# Patient Record
Sex: Male | Born: 1985 | Race: Black or African American | Hispanic: No | Marital: Single | Smoking: Never smoker
Health system: Southern US, Community
[De-identification: ages and names within clinical notes are randomized; demographics above are authoritative.]

## PROBLEM LIST (undated history)

## (undated) HISTORY — PX: KNEE SURGERY: SHX244

---
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
## 2006-01-03 ENCOUNTER — Emergency Department (HOSPITAL_COMMUNITY): Admission: EM | Admit: 2006-01-03 | Discharge: 2006-01-03 | Payer: Self-pay | Admitting: Family Medicine

## 2006-07-08 ENCOUNTER — Emergency Department (HOSPITAL_COMMUNITY): Admission: EM | Admit: 2006-07-08 | Discharge: 2006-07-08 | Payer: Self-pay | Admitting: Family Medicine

## 2007-01-09 ENCOUNTER — Emergency Department (HOSPITAL_COMMUNITY): Admission: EM | Admit: 2007-01-09 | Discharge: 2007-01-09 | Payer: Self-pay | Admitting: Family Medicine

## 2007-05-03 ENCOUNTER — Emergency Department (HOSPITAL_COMMUNITY): Admission: EM | Admit: 2007-05-03 | Discharge: 2007-05-03 | Payer: Self-pay | Admitting: Family Medicine

## 2007-06-28 ENCOUNTER — Emergency Department (HOSPITAL_COMMUNITY): Admission: EM | Admit: 2007-06-28 | Discharge: 2007-06-28 | Payer: Self-pay | Admitting: Neurosurgery

## 2007-07-13 ENCOUNTER — Emergency Department (HOSPITAL_COMMUNITY): Admission: EM | Admit: 2007-07-13 | Discharge: 2007-07-13 | Payer: Self-pay | Admitting: Emergency Medicine

## 2007-08-08 ENCOUNTER — Emergency Department (HOSPITAL_COMMUNITY): Admission: EM | Admit: 2007-08-08 | Discharge: 2007-08-08 | Payer: Self-pay | Admitting: Family Medicine

## 2008-03-19 ENCOUNTER — Emergency Department (HOSPITAL_COMMUNITY): Admission: EM | Admit: 2008-03-19 | Discharge: 2008-03-19 | Payer: Self-pay | Admitting: Emergency Medicine

## 2008-03-27 ENCOUNTER — Emergency Department (HOSPITAL_COMMUNITY): Admission: EM | Admit: 2008-03-27 | Discharge: 2008-03-27 | Payer: Self-pay | Admitting: Emergency Medicine

## 2008-11-28 ENCOUNTER — Emergency Department (HOSPITAL_COMMUNITY): Admission: EM | Admit: 2008-11-28 | Discharge: 2008-11-28 | Payer: Self-pay | Admitting: Family Medicine

## 2009-05-28 IMAGING — CR DG CHEST 2V
2 series · 2 of 2 positions shown · non-contrast
Comparison: 12/19/2005.  07/01/2002.

CLINICAL DATA: Cough.  History of bronchitis.

CHEST - 2 VIEW

[w chest pa *]
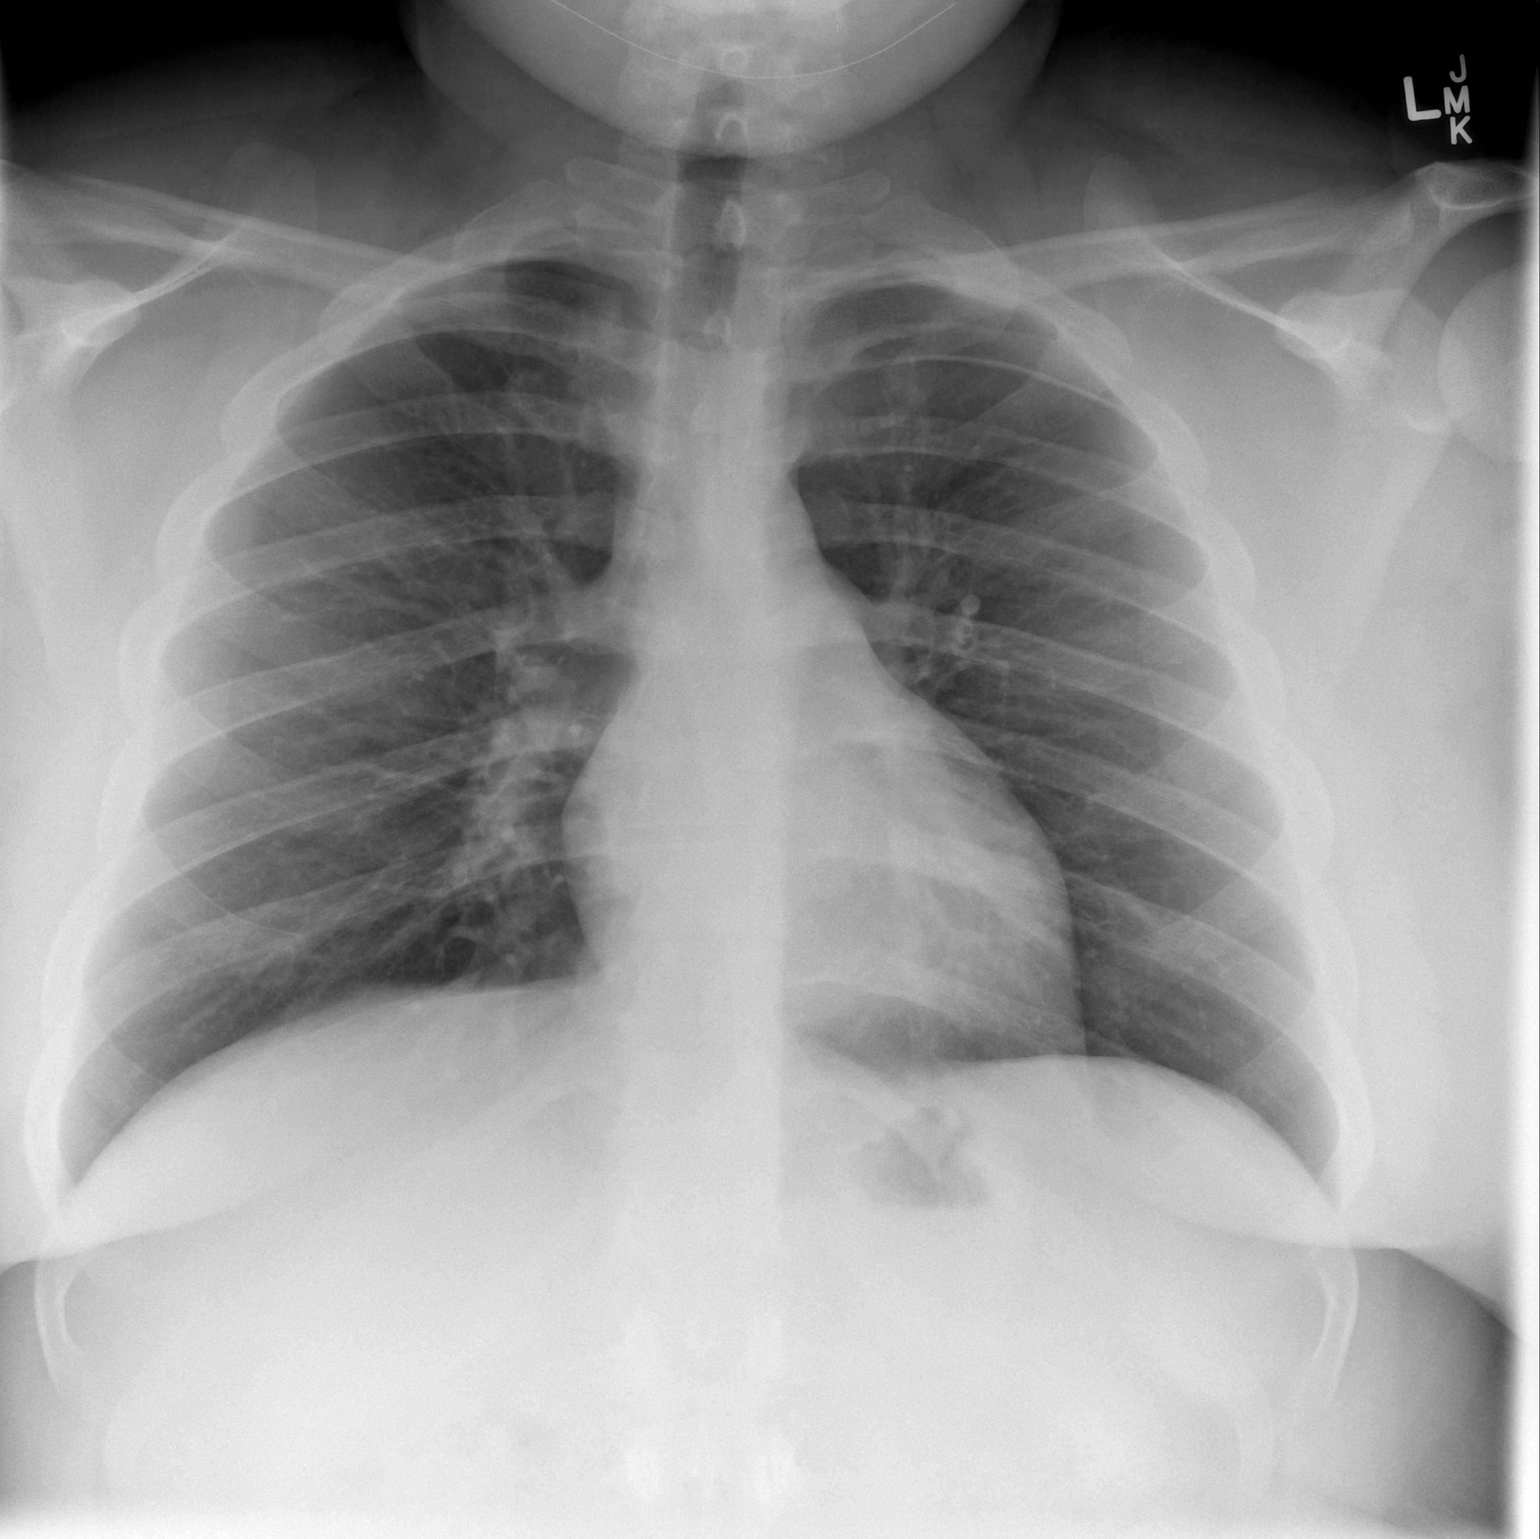

[w chest lat *]
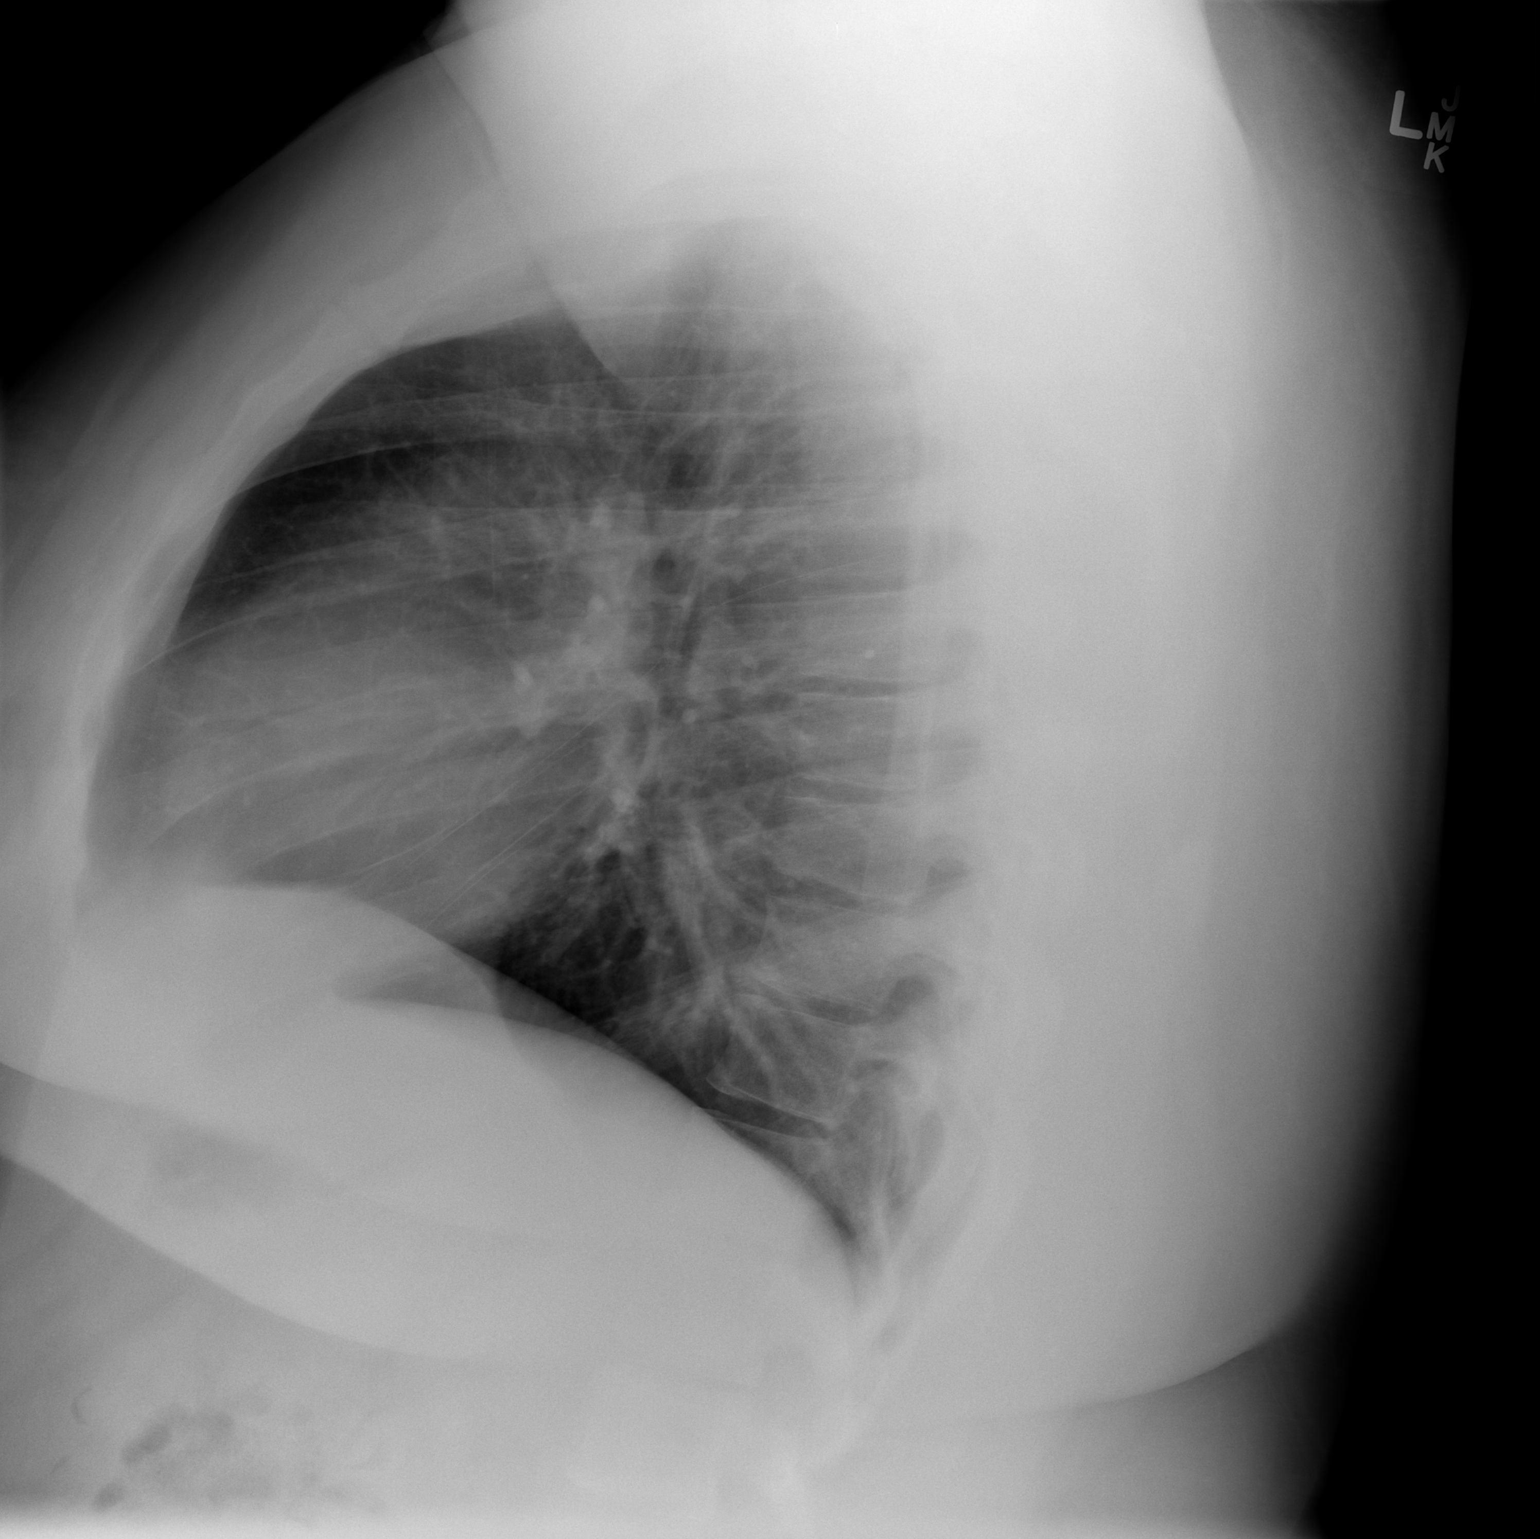

[2 of 2 positions shown; findings below may reference images not displayed]

FINDINGS: Mediastinal and cardiac silhouette unchanged and within
normal limits.  No infiltrate, congestive heart failure or
pneumothorax.
IMPRESSION: No infiltrate or congestive heart failure.

## 2010-06-12 ENCOUNTER — Emergency Department (HOSPITAL_COMMUNITY)
Admission: EM | Admit: 2010-06-12 | Discharge: 2010-06-12 | Payer: Self-pay | Source: Home / Self Care | Admitting: Family Medicine

## 2010-10-19 ENCOUNTER — Inpatient Hospital Stay (INDEPENDENT_AMBULATORY_CARE_PROVIDER_SITE_OTHER)
Admission: RE | Admit: 2010-10-19 | Discharge: 2010-10-19 | Disposition: A | Discharge status: Home / Self Care | Payer: Self-pay | Source: Ambulatory Visit | Attending: Family Medicine | Admitting: Family Medicine

## 2010-10-19 DIAGNOSIS — Z760 Encounter for issue of repeat prescription: Secondary | ICD-10-CM

## 2010-10-19 DIAGNOSIS — M25529 Pain in unspecified elbow: Secondary | ICD-10-CM

## 2010-10-30 NOTE — Op Note (Signed)
NAMEKALID, GHAN                         ACCOUNT NO.:  [id number]   MEDICAL RECORD NO.:  [id number]                   PATIENT TYPE:  AMB   LOCATION:  DSC                                  FACILITY:  [facility]   PHYSICIAN:  Rodney A. Chaney Malling, M.D.           DATE OF BIRTH:  28-May-1986   DATE OF PROCEDURE:  DATE OF DISCHARGE:                                 OPERATIVE REPORT   PREOPERATIVE DIAGNOSIS:  Ruptured acromioclavicular ligament, right knee.   POSTOPERATIVE DIAGNOSIS:  Ruptured acromioclavicular ligament, right knee.   OPERATION:  Reconstruction ACL, right knee, using patellar-tendon-patellar  bone, tibial tubercle bone with interference fixed screws.   SURGEON:  Dr. Chaney Malling.   ASSISTANT:  Dr. Alisa Graff.   ANESTHESIA:  General.   DESCRIPTION OF PROCEDURE:  The patient was placed on the operating room  table, in the supine position with a pneumatic tourniquet about the right  upper thigh.  The right leg was placed in the leg holder.  The entire right  lower extremity was prepped with Duraprep and draped out in the usual  manner.  An effusion cannula was placed in the superomedial pouch and used  the standard setting.  Very careful examination of the knee was undertaken.  The patellofemoral joint appeared normal with normal articular cartilage.  Both medial and lateral compartments were visualized.  I checked the  cartilage over both femoral condyles and both tibial plateaus and the entire  circumference of both medial and lateral compartments was normal.  The only  pathology was seen in the intercondylar notch where there was complete  disruption of the ACL from its femoral attachment.  This was displaced into  the lateral compartment.   With the arthroscope, the stump of the ACL was very aggressively debrided.  Once this was completed, the leg was wrapped out with Esmarch and returned  to elevator.  Incision made starting in the mid portion of the patella and  carried  down to the tibial tubercle.  The skin edges were retracted.  Bleeders were coagulated.  Using small scissors, the paratenon was opened  and the margins of the patellar tendon were carefully identified.  Using a  double bladed single handled knife, 10 mm in width, two parallel lines were  started in the middle of the patella and carried down to the tibial  tubercle.  Using a power saw, trapezoidal grafts from the tibial tubercle  and the patella which included patellar tendon graft, were harvested in the  usual manner.  Drill holes were placed to both bony blocks and these were  pared down to 10 mm diameter and placed on a sterile sponge on the back  table.   Attention was turned back to the knee.  Using a burr, a very generous  lateral notch plasty was done.  All debris was removed from the knee.  At  this point, the tibial guide was inserted into the  knee and placed dead  center in a tibial stump up just adjacent to the posterior cruciate  ligament.  Using the drill guide set at the proper length, the guide pin was  passed up into the tibial tubercle and this was felt to be in an anatomic  position.  The 10 mm cannulated drill was passed over the guide pin and  brought up to the tibial tubercle.  All the debris was the removed with the  articular shaver.  With the knee flexed, the femoral guide was passed  through the tibial hole and placed over lateral femoral condyle and held  into position.  A long Beef needle was passed up through the guide and  brought out through the anterior aspect of the thigh. A 10 mm drill was then  passed over the guide pin and a tunnel, 35 mm deep was then made.  The  anterior tip of the shaver was introduced and all debris was removed.  The  arthroscope was passed up into the tunnel and there was god capture at 360  degrees with good bony stump.  The graft was then taken from the back table,  sutures were passed through the eye of the Beef needle and the  sutures  pulled through the knee and the graft up into the knee.  There was excellent  seating into the femoral tunnel.  Fracture was placed on the bony block.  A  flexible guide pin was passed up through the knee with bony block into the  femoral tunnel.  A 7 mm x 30 mm interference screw with a sheath was passed  up and interference screw was driven home.  This gave excellent stability  and fixation proximally in the femur. Traction was then placed on the distal  bony plug, and a guide pin was passed adjacent to the bony plug in the tibia  and a 7 mm x 30 mm interference screw was then inserted here.  There was an  excellent fixation both proximally and distally.  The graft was palpated and  probed, and the graft is extremely snug.  No AP drawer was seen.  Bone was  taken from the tibia and placed in the donor site over the patella. Soft  tissue and paratenon was then closed with 2-0 Vicryl.  Subcutaneous tissue  was closed with 2-0 Vicryl and standard Steri-Strips were used to close the  skin.  This procedure went extremely well.   DRAINS:  None.   COMPLICATIONS:  None.                                               Rodney A. Chaney Malling, M.D.    RAM/MEDQ  D:  07/26/2002  T:  07/26/2002  Job:  [id number]

## 2010-10-30 NOTE — Op Note (Signed)
Derrick Hogan, Derrick Hogan                         ACCOUNT NO.:  [id number]   MEDICAL RECORD NO.:  [id number]                   PATIENT TYPE:  AMB   LOCATION:  DSC                                  FACILITY:  [facility]   PHYSICIAN:  Rodney A. Chaney Malling, M.D.           DATE OF BIRTH:  1986/05/21   DATE OF PROCEDURE:  11/15/2002  DATE OF DISCHARGE:                                 OPERATIVE REPORT   PREOPERATIVE DIAGNOSIS:  Status post reconstruction anterior cruciate  ligament right knee with impingement anterior cruciate ligament right knee.   POSTOPERATIVE DIAGNOSIS:  Status post reconstruction anterior cruciate  ligament right knee with impingement anterior cruciate ligament right knee.   PROCEDURE:  Exploration of right knee and notchplasty right knee.  Local  synovectomy.   SURGEON:  Lenard Galloway. Chaney Malling, M.D.   ANESTHESIA:  General.   FINDINGS:  With the patient on the operative table and under general  anesthesia stability testing was done.  The AP drawer was negative.  Lachman's test was negative.  There was a trace or hint of positive pivot  shift when this maneuver was done, but this was done with the opposite and  normal leg, and they are absolutely symmetrical.   The arthroscope was placed in the knee, and I took the contour over the  patella and femoral notch and both femoral condyles were normal as was  articular cartilage and tibial plateau, and both menisci were normal.  No  other pathology is seen in the femoral notch area.  The ACL was intact.  This was palpated with a probe, and this was still functioning.  Distally on  the medial femoral side of the ACL, there is fraying and tearing of the  synovium.  The underlying fibers of the reconstructed ACL appeared normal  and were palpated again and showed good integrity.  There was a large  deposit of scar tissue anterior to the distal insertion of the reconstructed  ACL.  On full extension of this, it banged into the  anterior notch.  Again,  there is some fraying of the synovium on the medial side implying some  impingement forward and medial notch as the knee was in extension.   DESCRIPTION OF PROCEDURE:  The patient was placed on the operative table in  the supine position.  The pneumatic was placed around the right upper thigh.  The right leg was placed in the leg holder, and the entire right lower  extremity prepped with DuraPrep and draped out in the usual manner.  The  infusion cannula was placed in the superior and medial pouch and the knee  distended with saline.  Anterior medial and anterior left portals were made,  and the arthroscope was introduced.  The findings are as described above.  There was strain of the synovium over the medial side of the ACL just  slightly proximal to its distal insertion.  This was debrided  with the intra-  articular shaver.  A probe was used and the ACL itself was intact.  The AP  drawer was done under direct vision and no translation was seen.  There was  a large scar deposit anterior to the tibial spine, and on full extension  this area banged against the anterior aspect.  This was debrided with a  series of baskets followed the intra-articular distributor.  The frayed  synovium was also debrided.  At this point, it was felt that the notchplasty  should be enlarged.  Through both portals the large bur was inserted.  We  did a notchplasty on the medial side superiorly and laterally.  Great care  was taken to avoid any involvement injury to the reconstructed graft.  Excellent decompression notch was achieved. Once this was completed,  multiple trials were made with the knee in full extension and complete  decompression of the ACL graft was achieved.  There was no rubbing on the  graft in any position.  The chondroplasty shaver was also to remove scar  tissue was bone was burred back.  Again, excellent decompression  was achieved.  Marcaine was then placed, and  large bulky dressing was  applied.  The patient returned to the recovery room in excellent condition.  Technically, this procedure went extremely well.  Drains none.  Complications none.                                               Rodney A. Chaney Malling, M.D.    RAM/MEDQ  D:  11/15/2002  T:  11/15/2002  Job:  [id number]

## 2011-06-28 ENCOUNTER — Emergency Department (INDEPENDENT_AMBULATORY_CARE_PROVIDER_SITE_OTHER)
Admission: EM | Admit: 2011-06-28 | Discharge: 2011-06-28 | Disposition: A | Discharge status: Home / Self Care | Payer: Self-pay | Source: Home / Self Care

## 2011-06-28 ENCOUNTER — Encounter (HOSPITAL_COMMUNITY): Payer: Self-pay

## 2011-06-28 ENCOUNTER — Other Ambulatory Visit: Payer: Self-pay

## 2011-06-28 DIAGNOSIS — R42 Dizziness and giddiness: Secondary | ICD-10-CM

## 2011-06-28 LAB — POCT I-STAT, CHEM 8
Creatinine, Ser: 1.1 mg/dL (ref 0.50–1.35)
Sodium: 141 mEq/L (ref 135–145)
TCO2: 26 mmol/L (ref 0–100)

## 2011-06-28 NOTE — ED Provider Notes (Signed)
History     CSN: [id number]  Arrival date & time 06/28/11  0855   None     Chief Complaint  Patient presents with  . Dizziness    (Consider location/radiation/quality/duration/timing/severity/associated sxs/prior treatment) HPI Comments: Pt states that he has been feeling "woozy" intermittently for the last 3 weeks. He becomes lightheaded with exertion, such as going up the stairs and describes it as "how you feel after you've been drinking alcohol." The lightheadedness will last up to 4 hrs after it begins and sometimes goes away within a few minutes with rest. He also states that eating sometimes brings on the feeling and is concerned about diabetes as it runs in his family. He denies chest pain/pressure, palpitations or dyspnea. No vertigo or HA.    Past Medical History  Diagnosis Date  . Asthma     Past Surgical History  Procedure Date  . Knee surgery     History reviewed. No pertinent family history.  History  Substance Use Topics  . Smoking status: Never Smoker   . Smokeless tobacco: Not on file  . Alcohol Use: Yes     social      Review of Systems  Constitutional: Negative for fever, chills and fatigue.  HENT: Negative for ear pain, sore throat, rhinorrhea, sneezing, postnasal drip and sinus pressure.   Respiratory: Negative for cough, shortness of breath and wheezing.   Cardiovascular: Negative for chest pain and palpitations.  Genitourinary: Negative for dysuria and frequency.  Neurological: Positive for light-headedness.    Allergies  Tuberculin tests  Home Medications   Current Outpatient Rx  Name Route Sig Dispense Refill  . ALBUTEROL IN Inhalation Inhale into the lungs as needed.    . ASPIRIN PO Oral Take by mouth daily.      BP 127/63  Pulse 82  Temp(Src) 98.5 F (36.9 C) (Oral)  Resp 16  SpO2 100%  Physical Exam  Nursing note and vitals reviewed. Constitutional: He appears well-developed and well-nourished. No distress.       Obese    HENT:  Head: Normocephalic and atraumatic.  Right Ear: Tympanic membrane, external ear and ear canal normal.  Left Ear: Tympanic membrane, external ear and ear canal normal.  Nose: Nose normal.  Mouth/Throat: Uvula is midline, oropharynx is clear and moist and mucous membranes are normal. No oropharyngeal exudate, posterior oropharyngeal edema or posterior oropharyngeal erythema.  Eyes: Conjunctivae are normal. Pupils are equal, round, and reactive to light.  Neck: Neck supple.  Cardiovascular: Normal rate, regular rhythm and normal heart sounds.   Pulmonary/Chest: Effort normal and breath sounds normal. No respiratory distress.  Lymphadenopathy:    He has no cervical adenopathy.  Neurological: He is alert.  Skin: Skin is warm and dry.  Psychiatric: He has a normal mood and affect.    ED Course  Procedures (including critical care time)  Labs Reviewed  POCT I-STAT, CHEM 8 - Abnormal; Notable for the following:    Hemoglobin 17.3 (*)    All other components within normal limits  I-STAT, CHEM 8   No results found.   1. Intermittent lightheadedness       MDM  EKG NSR, rate 78. No orthostatic changes. I stat neg .        Melody Comas, [place] 06/28/11 2047

## 2011-06-28 NOTE — ED Notes (Signed)
C/o intermittent dizziness for 3 weeks.  States the dizziness usually occurs with position change or bending over.  Denies recent illness or other sx.

## 2011-06-29 NOTE — ED Provider Notes (Signed)
Medical screening examination/treatment/procedure(s) were performed by non-physician practitioner and as supervising physician I was immediately available for consultation/collaboration.   MORENO-COLL,Sixto Bowdish; MD   Stacye Noori Moreno-Coll, MD 06/29/11 0719 

## 2011-08-16 ENCOUNTER — Encounter: Payer: Self-pay | Admitting: Family

## 2011-08-16 ENCOUNTER — Ambulatory Visit (INDEPENDENT_AMBULATORY_CARE_PROVIDER_SITE_OTHER): Payer: BC Managed Care – PPO | Admitting: Family

## 2011-08-16 DIAGNOSIS — J309 Allergic rhinitis, unspecified: Secondary | ICD-10-CM

## 2011-08-16 DIAGNOSIS — H6980 Other specified disorders of Eustachian tube, unspecified ear: Secondary | ICD-10-CM

## 2011-08-16 DIAGNOSIS — E669 Obesity, unspecified: Secondary | ICD-10-CM

## 2011-08-16 DIAGNOSIS — H6990 Unspecified Eustachian tube disorder, unspecified ear: Secondary | ICD-10-CM

## 2011-08-16 LAB — CBC WITH DIFFERENTIAL/PLATELET
Eosinophils Absolute: 0.3 10*3/uL (ref 0.0–0.7)
Eosinophils Relative: 4.2 % (ref 0.0–5.0)
Lymphs Abs: 2.7 10*3/uL (ref 0.7–4.0)
MCHC: 32.6 g/dL (ref 30.0–36.0)
Monocytes Absolute: 0.7 10*3/uL (ref 0.1–1.0)
Monocytes Relative: 9.4 % (ref 3.0–12.0)
Neutro Abs: 4 10*3/uL (ref 1.4–7.7)
RBC: 5.46 Mil/uL (ref 4.22–5.81)
RDW: 14.4 % (ref 11.5–14.6)

## 2011-08-16 LAB — BASIC METABOLIC PANEL
BUN: 12 mg/dL (ref 6–23)
Calcium: 9.2 mg/dL (ref 8.4–10.5)
Chloride: 107 mEq/L (ref 96–112)
Creatinine, Ser: 1 mg/dL (ref 0.4–1.5)
Potassium: 3.8 mEq/L (ref 3.5–5.1)
Sodium: 139 mEq/L (ref 135–145)

## 2011-08-16 LAB — TSH: TSH: 1.61 u[IU]/mL (ref 0.35–5.50)

## 2011-08-16 MED ORDER — FEXOFENADINE-PSEUDOEPHED ER 180-240 MG PO TB24: 1.0000 | ORAL_TABLET | Freq: Every day | ORAL | Status: AC

## 2011-08-16 NOTE — Progress Notes (Signed)
  Subjective:    Patient ID: Derrick Hogan, male    DOB: 02-26-1986, 26 y.o.   MRN: [id number]  HPI 26 year old [place] American male, nonsmoker the patient to the practice is in with complaints of dizziness that going on for about a month. Patient reports having random feelings of dizziness with sudden onset usually when he is moving around. The dizziness typically last for hours or less and it is associated with right ear popping. He denies any sneezing, coughing, congestion, blurred vision, double vision, chest pain or palpitations. He does have a past medical history of allergic rhinitis. Not currently taking any medication for it.   Review of Systems  Constitutional: Negative.   HENT: Negative.  Negative for congestion, rhinorrhea, sneezing and postnasal drip.        Ears popping  Respiratory: Negative.   Cardiovascular: Negative.   Gastrointestinal: Negative.   Musculoskeletal: Negative.   Skin: Negative.   Neurological: Negative.  Negative for headaches.  Hematological: Negative.   Psychiatric/Behavioral: Negative.    Past Medical History  Diagnosis Date  . Asthma     History   Social History  . Marital Status: Single    Spouse Name: N/A    Number of Children: N/A  . Years of Education: N/A   Occupational History  . Not on file.   Social History Main Topics  . Smoking status: Never Smoker   . Smokeless tobacco: Not on file  . Alcohol Use: Yes     social  . Drug Use: No  . Sexually Active:    Other Topics Concern  . Not on file   Social History Narrative  . No narrative on file    Past Surgical History  Procedure Date  . Knee surgery     No family history on file.  Allergies  Allergen Reactions  . Tuberculin Tests     Current Outpatient Prescriptions on File Prior to Visit  Medication Sig Dispense Refill  . ALBUTEROL IN Inhale into the lungs as needed.      . ASPIRIN PO Take by mouth daily.        BP 124/80  Ht 5\' 11"  (1.803 m)  Wt 360 lb  (163.295 kg)  BMI 50.21 kg/m2chart    Objective:   Physical Exam  Constitutional: He appears well-developed and well-nourished.  HENT:  Right Ear: External ear normal.  Left Ear: External ear normal.  Nose: Nose normal.  Mouth/Throat: Oropharynx is clear and moist.  Neck: Normal range of motion. Neck supple.  Cardiovascular: Normal rate, regular rhythm and normal heart sounds.   Pulmonary/Chest: Effort normal and breath sounds normal.  Musculoskeletal: Normal range of motion.  Neurological: He is alert.  Skin: Skin is warm and dry.  Psychiatric: He has a normal mood and affect.          Assessment & Plan:  Assessment: Eustachian tube dysfunction, vertigo  Plan: Allegra-D 24 hours once daily. Labs and to include CBC, BMP, TSH patient had a right salt encouraged healthy diet and exercise. Patient to call the office if symptoms worsen or persist, recheck as scheduled and when necessary.

## 2011-08-16 NOTE — Patient Instructions (Signed)
Barotitis Media Barotitis media is soreness (inflammation) of the area behind the eardrum (middle ear). This occurs when the auditory tube (Eustachian tube) leading from the back of the throat to the eardrum is blocked. When it is blocked air cannot move in and out of the middle ear to equalize pressure changes. These pressure changes come from changes in altitude when:  Flying.   Driving in the mountains.   Diving.  Problems are more likely to occur with pressure changes during times when you are congested as from:  Hay fever.   Upper respiratory infection.   A cold.  Damage or hearing loss (barotrauma) caused by this may be permanent. HOME CARE INSTRUCTIONS   Use medicines as recommended by your caregiver. Over the counter medicines will help unblock the canal and can help during times of air travel.   Do not put anything into your ears to clean or unplug them. Eardrops will not be helpful.   Do not swim, dive, or fly until your caregiver says it is all right to do so. If these activities are necessary, chewing gum with frequent swallowing may help. It is also helpful to hold your nose and gently blow to pop your ears for equalizing pressure changes. This forces air into the Eustachian tube.   For little ones with problems, give your baby a bottle of water or juice during periods when pressure changes would be anticipated such as during take offs and landings associated with air travel.   Only take over-the-counter or prescription medicines for pain, discomfort, or fever as directed by your caregiver.   A decongestant may be helpful in de-congesting the middle ear and make pressure equalization easier. This can be even more effective if the drops (spray) are delivered with the head lying over the edge of a bed with the head tilted toward the ear on the affected side.   If your caregiver has given you a follow-up appointment, it is very important to keep that appointment. Not keeping  the appointment could result in a chronic or permanent injury, pain, hearing loss and disability. If there is any problem keeping the appointment, you must call back to this facility for assistance.  SEEK IMMEDIATE MEDICAL CARE IF:   You develop a severe headache, dizziness, severe ear pain, or bloody or pus-like drainage from your ears.   An oral temperature above 102 F (38.9 C) develops.   Your problems do not improve or become worse.  MAKE SURE YOU:   Understand these instructions.   Will watch your condition.   Will get help right away if you are not doing well or get worse.  Document Released: 05/28/2000 Document Revised: 05/20/2011 Document Reviewed: 01/04/2008 Exit[employer] Patient Information 2012 [employer]. 

## 2011-12-14 ENCOUNTER — Ambulatory Visit (INDEPENDENT_AMBULATORY_CARE_PROVIDER_SITE_OTHER): Payer: BC Managed Care – PPO | Admitting: Family

## 2011-12-14 ENCOUNTER — Encounter: Payer: Self-pay | Admitting: Family

## 2011-12-14 VITALS — BP 114/68 | Temp 98.2°F | Wt >= 6400 oz

## 2011-12-14 DIAGNOSIS — R0683 Snoring: Secondary | ICD-10-CM

## 2011-12-14 DIAGNOSIS — G4730 Sleep apnea, unspecified: Secondary | ICD-10-CM

## 2011-12-14 DIAGNOSIS — R0989 Other specified symptoms and signs involving the circulatory and respiratory systems: Secondary | ICD-10-CM

## 2011-12-14 DIAGNOSIS — R350 Frequency of micturition: Secondary | ICD-10-CM

## 2011-12-14 LAB — POCT URINALYSIS DIPSTICK
Bilirubin, UA: NEGATIVE
Blood, UA: NEGATIVE
Glucose, UA: NEGATIVE
Nitrite, UA: NEGATIVE
Protein, UA: NEGATIVE
pH, UA: 7

## 2011-12-14 LAB — GLUCOSE, POCT (MANUAL RESULT ENTRY): POC Glucose: 88 mg/dl (ref 70–99)

## 2011-12-14 NOTE — Patient Instructions (Addendum)
Dehydration, Adult Dehydration is when you lose more fluids from the body than you take in. Vital organs like the kidneys, brain, and heart cannot function without a proper amount of fluids and salt. Any loss of fluids from the body can cause dehydration.  CAUSES   Vomiting.   Diarrhea.   Excessive sweating.   Excessive urine output.   Fever.  SYMPTOMS  Mild dehydration  Thirst.   Dry lips.   Slightly dry mouth.  Moderate dehydration  Very dry mouth.   Sunken eyes.   Skin does not bounce back quickly when lightly pinched and released.   Dark urine and decreased urine production.   Decreased tear production.   Headache.  Severe dehydration  Very dry mouth.   Extreme thirst.   Rapid, weak pulse (more than 100 beats per minute at rest).   Cold hands and feet.   Not able to sweat in spite of heat and temperature.   Rapid breathing.   Blue lips.   Confusion and lethargy.   Difficulty being awakened.   Minimal urine production.   No tears.  DIAGNOSIS  Your caregiver will diagnose dehydration based on your symptoms and your exam. Blood and urine tests will help confirm the diagnosis. The diagnostic evaluation should also identify the cause of dehydration. TREATMENT  Treatment of mild or moderate dehydration can often be done at home by increasing the amount of fluids that you drink. It is best to drink small amounts of fluid more often. Drinking too much at one time can make vomiting worse. Refer to the home care instructions below. Severe dehydration needs to be treated at the hospital where you will probably be given intravenous (IV) fluids that contain water and electrolytes. HOME CARE INSTRUCTIONS   Ask your caregiver about specific rehydration instructions.   Drink enough fluids to keep your urine clear or pale yellow.   Drink small amounts frequently if you have nausea and vomiting.   Eat as you normally do.   Avoid:   Foods or drinks high in  sugar.   Carbonated drinks.   Juice.   Extremely hot or cold fluids.   Drinks with caffeine.   Fatty, greasy foods.   Alcohol.   Tobacco.   Overeating.   Gelatin desserts.   Wash your hands well to avoid spreading bacteria and viruses.   Only take over-the-counter or prescription medicines for pain, discomfort, or fever as directed by your caregiver.   Ask your caregiver if you should continue all prescribed and over-the-counter medicines.   Keep all follow-up appointments with your caregiver.  SEEK MEDICAL CARE IF:  You have abdominal pain and it increases or stays in one area (localizes).   You have a rash, stiff neck, or severe headache.   You are irritable, sleepy, or difficult to awaken.   You are weak, dizzy, or extremely thirsty.  SEEK IMMEDIATE MEDICAL CARE IF:   You are unable to keep fluids down or you get worse despite treatment.   You have frequent episodes of vomiting or diarrhea.   You have blood or green matter (bile) in your vomit.   You have blood in your stool or your stool looks black and tarry.   You have not urinated in 6 to 8 hours, or you have only urinated a small amount of very dark urine.   You have a fever.   You faint.  MAKE SURE YOU:   Understand these instructions.   Will watch your condition.     Will get help right away if you are not doing well or get worse.  Document Released: 05/31/2005 Document Revised: 05/20/2011 Document Reviewed: 01/18/2011 [employer] Patient Information 2012 [employer]. 

## 2011-12-14 NOTE — Progress Notes (Signed)
Subjective:    Patient ID: Derrick Hogan, male    DOB: [birth year], 26 y.o.   MRN: [id number]  HPI 26 year old [place] American male is in with complaints of urinary frequency x3 days. Symptoms have resolved. He began on Friday he believes after consuming a large amount of tea and lemonade while working outside. When he went home from work, he began to feel shaky and hot. His symptoms have completely resolved today. He denies any increase in thirst. Denies any abdominal pain, back pain, burning with urination, or blood in his urine.  Patient has concerns of sleep apnea. He reports his girlfriend nudging him several times during the night to get him to start breathing. He periodically uses an albuterol inhaler at bedtime to help him get adequate oxygen. He does report snoring loudly. Patient is 402 pounds.   Review of Systems  Constitutional: Positive for fatigue. Negative for fever.  HENT: Negative.   Respiratory: Negative.   Cardiovascular: Negative.  Negative for chest pain and leg swelling.  Gastrointestinal: Negative.   Genitourinary: Positive for frequency. Negative for urgency, hematuria, scrotal swelling and testicular pain.  Skin: Negative.   Neurological: Negative.   Hematological: Negative.   Psychiatric/Behavioral:       Occasionally feels down   Past Medical History  Diagnosis Date  . Asthma     History   Social History  . Marital Status: Single    Spouse Name: N/A    Number of Children: N/A  . Years of Education: N/A   Occupational History  . Not on file.   Social History Main Topics  . Smoking status: Never Smoker   . Smokeless tobacco: Not on file  . Alcohol Use: Yes     social  . Drug Use: No  . Sexually Active:    Other Topics Concern  . Not on file   Social History Narrative  . No narrative on file    Past Surgical History  Procedure Date  . Knee surgery     No family history on file.  Allergies  Allergen Reactions  . Tuberculin Tests       Current Outpatient Prescriptions on File Prior to Visit  Medication Sig Dispense Refill  . ALBUTEROL IN Inhale into the lungs as needed.      . ASPIRIN PO Take by mouth daily.      . Multiple Vitamin (MULTIVITAMIN) tablet Take 1 tablet by mouth daily.      . fexofenadine-pseudoephedrine (ALLEGRA-D 24) 180-240 MG per 24 hr tablet Take 1 tablet by mouth daily.  30 tablet  2    BP 114/68  Temp 98.2 F (36.8 C) (Oral)  Wt 402 lb 9.6 oz (182.618 kg)chart    Objective:   Physical Exam  Constitutional: He is oriented to person, place, and time. He appears well-developed and well-nourished.  HENT:  Right Ear: External ear normal.  Left Ear: External ear normal.  Nose: Nose normal.  Mouth/Throat: Oropharynx is clear and moist.  Neck: Normal range of motion. Neck supple. No thyromegaly present.       Large neck girth  Cardiovascular: Normal rate, regular rhythm and normal heart sounds.   Pulmonary/Chest: Effort normal and breath sounds normal.  Abdominal: Soft. Bowel sounds are normal.  Musculoskeletal: Normal range of motion.  Neurological: He is alert and oriented to person, place, and time. He has normal reflexes.  Skin: Skin is warm and dry.  Psychiatric: He has a normal mood and affect.  Assessment & Plan:  Assessment: Obesity, snoring, urinary frequency, apnea sleep  Plan: We'll refer patient for sleep study. Labs from complete physical exam 3 months ago were all normal. UA today is normal. Patient advised to return if his symptoms return. It sounds like he may have suffered from dehydration on Friday and is better today.

## 2011-12-15 ENCOUNTER — Other Ambulatory Visit: Payer: Self-pay

## 2011-12-15 DIAGNOSIS — G4730 Sleep apnea, unspecified: Secondary | ICD-10-CM

## 2011-12-15 DIAGNOSIS — R0683 Snoring: Secondary | ICD-10-CM

## 2012-01-04 ENCOUNTER — Ambulatory Visit (HOSPITAL_BASED_OUTPATIENT_CLINIC_OR_DEPARTMENT_OTHER): Payer: BC Managed Care – PPO | Attending: Family | Admitting: Radiology

## 2012-01-04 VITALS — Ht 71.0 in | Wt >= 6400 oz

## 2012-01-04 DIAGNOSIS — G4710 Hypersomnia, unspecified: Secondary | ICD-10-CM | POA: Insufficient documentation

## 2012-01-04 DIAGNOSIS — G4730 Sleep apnea, unspecified: Secondary | ICD-10-CM

## 2012-01-04 DIAGNOSIS — E669 Obesity, unspecified: Secondary | ICD-10-CM

## 2012-01-04 DIAGNOSIS — R0683 Snoring: Secondary | ICD-10-CM

## 2012-01-08 DIAGNOSIS — G4710 Hypersomnia, unspecified: Secondary | ICD-10-CM

## 2012-01-08 DIAGNOSIS — G4730 Sleep apnea, unspecified: Secondary | ICD-10-CM

## 2012-01-09 NOTE — Procedures (Signed)
Derrick Hogan, MCMICHAEL NO.:  [id number]  MEDICAL RECORD NO.:  [id number]          PATIENT TYPE:  OUT  LOCATION:  SLEEP CENTER                 FACILITY:  [facility]  PHYSICIAN:  Clinton D. Maple Hudson, MD, FCCP, FACPDATE OF BIRTH:  12-Mar-1987  DATE OF STUDY:  01/04/2012                           NOCTURNAL POLYSOMNOGRAM  REFERRING PHYSICIAN:  Padonda FNP Orvan Falconer  INDICATION FOR STUDY:  Hypersomnia with sleep apnea.  EPWORTH SLEEPINESS SCORE:  7/24.  BMI 55.8.  Weight 400 pounds, height 71 inches, neck 20 inches.  MEDICATIONS:  Home medications are charted and reviewed.  SLEEP ARCHITECTURE:  Total sleep time 321 minutes with sleep efficiency 90%.  Stage I was 7.3%, stage II 73.4%, stage III absent, REM 19.3% of total sleep time.  Sleep latency 15.5 minutes, REM latency 113 minutes, awake after sleep onset 18.5 minutes.  Arousal index 6.  BEDTIME MEDICATION:  Ventolin.  RESPIRATORY DATA:  Apnea-hypotony index (AHI) 5 per hour.  A total of 27 events was scored including 2 obstructive apneas, 1 central apnea, 24 hypopneas.  All events were associated with supine sleep position and REM.  REM AHI 23.2 per hour.  There were insufficient numbers of events to meet protocol requirements for split protocol CPAP titration on the study night.  OXYGEN DATA:  Moderate-to-loud snoring with oxygen desaturation to a nadir of 90% and mean oxygen saturation through the study of 97.3% on room air.  CARDIAC DATA:  Normal sinus rhythm.  MOVEMENT-PARASOMNIA:  No significant movement disturbance.  No bathroom trips.  IMPRESSIONS-RECOMMENDATIONS: 1. Occasional respiratory events with sleep disturbance at the upper     limits of normal.  AHI 5.0 per hour (the normal range for adults is     from 0-5 events per hour).  Moderate-to-loud snoring with oxygen     desaturation to a nadir of 90% and mean oxygen saturation through     the study of 97.3% on room air. 2. Scores in this range are  usually addressed with conservative     measures including encouragement to lose weight.     Clinton D. Maple Hudson, MD, [facility], FACP Diplomate, American Board of Sleep Medicine    CDY/MEDQ  D:  01/08/2012 15:36:41  T:  01/09/2012 06:39:53  Job:  [id number]

## 2012-04-12 ENCOUNTER — Telehealth: Payer: Self-pay | Admitting: Family

## 2012-04-12 NOTE — Telephone Encounter (Signed)
Pt can't afford Allegra D. Pt requesting another med. [facility] @ [address]. [phone]

## 2012-04-12 NOTE — Telephone Encounter (Signed)
Left message to advise pt of OTC antihistamine decongestants. Advised pt to speak to pharmacist about sxs and suggestions for a cheaper store brand medication. Pt advised to call office with questions or concerns

## 2012-04-13 ENCOUNTER — Ambulatory Visit: Payer: BC Managed Care – PPO | Admitting: Family

## 2012-04-28 ENCOUNTER — Encounter: Payer: Self-pay | Admitting: Family

## 2012-04-28 ENCOUNTER — Ambulatory Visit (INDEPENDENT_AMBULATORY_CARE_PROVIDER_SITE_OTHER): Payer: BC Managed Care – PPO | Admitting: Family

## 2012-04-28 VITALS — BP 126/76 | HR 67 | Temp 98.1°F | Wt >= 6400 oz

## 2012-04-28 DIAGNOSIS — R42 Dizziness and giddiness: Secondary | ICD-10-CM

## 2012-04-28 DIAGNOSIS — H6120 Impacted cerumen, unspecified ear: Secondary | ICD-10-CM

## 2012-04-28 LAB — CBC WITH DIFFERENTIAL/PLATELET
Basophils Absolute: 0.1 10*3/uL (ref 0.0–0.1)
Eosinophils Absolute: 0.3 10*3/uL (ref 0.0–0.7)
HCT: 43.7 % (ref 39.0–52.0)
Hemoglobin: 14 g/dL (ref 13.0–17.0)
Lymphs Abs: 2.1 10*3/uL (ref 0.7–4.0)
MCV: 80 fl (ref 78.0–100.0)
Monocytes Absolute: 1 10*3/uL (ref 0.1–1.0)
Monocytes Relative: 11.4 % (ref 3.0–12.0)
Neutrophils Relative %: 58.5 % (ref 43.0–77.0)
Platelets: 245 10*3/uL (ref 150.0–400.0)
RBC: 5.46 Mil/uL (ref 4.22–5.81)

## 2012-04-28 LAB — BASIC METABOLIC PANEL
CO2: 24 mEq/L (ref 19–32)
Calcium: 9.4 mg/dL (ref 8.4–10.5)
Chloride: 107 mEq/L (ref 96–112)
Creatinine, Ser: 1.1 mg/dL (ref 0.4–1.5)
GFR: 107.63 mL/min (ref >60.00)
Potassium: 3.7 mEq/L (ref 3.5–5.1)

## 2012-04-28 NOTE — Progress Notes (Signed)
Subjective:    Patient ID: Derrick Hogan, male    DOB: 02/22/1986, 26 y.o.   MRN: [id number]  HPI 26 year old African American male, obese, is in with complaints of feeling woozy and dizzy intermittently x2 weeks. His symptoms were worse yesterday. He had similar symptoms in January of this year old related to a eustachian tube dysfunction. Patient has been off Allegra-D x2 weeks. He did start a dose today. He denies any headache, blurred vision, double vision, shortness of breath, chest pain or palpitations. Has not taken any medication for relief.   Review of Systems  Constitutional: Negative.   HENT: Positive for postnasal drip. Negative for nosebleeds, congestion and sore throat.   Eyes: Negative.   Respiratory: Negative.   Cardiovascular: Negative.   Skin: Negative.   Neurological: Positive for dizziness and light-headedness. Negative for headaches.  Hematological: Negative.   Psychiatric/Behavioral: Negative.    Past Medical History  Diagnosis Date  . Asthma     History   Social History  . Marital Status: Single    Spouse Name: N/A    Number of Children: N/A  . Years of Education: N/A   Occupational History  . Not on file.   Social History Main Topics  . Smoking status: Never Smoker   . Smokeless tobacco: Not on file  . Alcohol Use: Yes     Comment: social  . Drug Use: No  . Sexually Active:    Other Topics Concern  . Not on file   Social History Narrative  . No narrative on file    Past Surgical History  Procedure Date  . Knee surgery     No family history on file.  Allergies  Allergen Reactions  . Tuberculin Tests     Current Outpatient Prescriptions on File Prior to Visit  Medication Sig Dispense Refill  . ALBUTEROL IN Inhale into the lungs as needed.      . ASPIRIN PO Take by mouth daily.      . fexofenadine-pseudoephedrine (ALLEGRA-D 24) 180-240 MG per 24 hr tablet Take 1 tablet by mouth daily.  30 tablet  2  . Multiple Vitamin  (MULTIVITAMIN) tablet Take 1 tablet by mouth daily.        BP 126/76  Pulse 67  Temp 98.1 F (36.7 C) (Oral)  Wt 407 lb 3.2 oz (184.705 kg)  SpO2 98%chart   Objective:   Physical Exam  Constitutional: He is oriented to person, place, and time. He appears well-developed and well-nourished.  HENT:  Left Ear: External ear normal.  Nose: Nose normal.  Mouth/Throat: Oropharynx is clear and moist.       Right ear cerumen impaction  Eyes: Conjunctivae normal and EOM are normal. Pupils are equal, round, and reactive to light.  Neck: Normal range of motion. Neck supple.  Cardiovascular: Normal rate, regular rhythm and normal heart sounds.   Pulmonary/Chest: Effort normal and breath sounds normal.  Neurological: He is alert and oriented to person, place, and time. He has normal reflexes. No cranial nerve deficit. Coordination normal.  Skin: Skin is warm and dry.  Psychiatric: He has a normal mood and affect.    Informed consent was obtained and peroxide gel was inserted into the ears bilaterally using the lavage kit the ears were lavaged until clean.Inspection with a cerumen spoon removed residual wax. Patient tolerated the procedure well.       Assessment & Plan:  Assessment: Intermittent lightheadedness, cerumen impaction, eustachian tube dysfunction  Plan: Allegra-D 24 hours  once daily. Patient advised to call the office if symptoms worsen or persist. Recheck a schedule, appearing.

## 2012-09-28 ENCOUNTER — Ambulatory Visit: Payer: BC Managed Care – PPO | Admitting: Family

## 2012-09-28 ENCOUNTER — Encounter: Payer: Self-pay | Admitting: Family

## 2012-09-28 ENCOUNTER — Ambulatory Visit (INDEPENDENT_AMBULATORY_CARE_PROVIDER_SITE_OTHER): Payer: BC Managed Care – PPO | Admitting: Family

## 2012-09-28 VITALS — BP 124/86 | HR 87 | Wt >= 6400 oz

## 2012-09-28 DIAGNOSIS — Z113 Encounter for screening for infections with a predominantly sexual mode of transmission: Secondary | ICD-10-CM

## 2012-09-28 DIAGNOSIS — N529 Male erectile dysfunction, unspecified: Secondary | ICD-10-CM

## 2012-09-28 DIAGNOSIS — L738 Other specified follicular disorders: Secondary | ICD-10-CM

## 2012-09-28 DIAGNOSIS — L739 Follicular disorder, unspecified: Secondary | ICD-10-CM

## 2012-09-28 LAB — TESTOSTERONE: Testosterone: 353.59 ng/dL (ref 350.00–890.00)

## 2012-09-28 NOTE — Patient Instructions (Addendum)
Erectile Dysfunction  Erectile dysfunction (ED) is the inability to get a good enough erection to have sexual intercourse. ED may involve:  · Inability to get an erection.  · Lack of enough hardness to allow penetration.  · Loss of the erection before sex is finished.  · Premature ejaculation.  · Any combination of these problems if they occur more than 25% of the time.  CAUSES  · Certain drugs, such as:  · Pain relievers.  · Antihistamines.  · Antidepressants.  · Blood pressure medicines.  · Water pills.  · Ulcer medicines.  · Muscle relaxants.  · Illegal drugs.  · Excessive drinking.  · Psychological causes, such as:  · Anxiety.  · Depression.  · Sadness.  · Exhaustion.  · Performance fear.  · Stress.  · Physical causes, such as:  · Artery problems. This may include diabetes, smoking, liver disease, or atherosclerosis.  · High blood pressure.  · Hormonal problems, such as low testosterone.  · Obesity.  · Nerve problems. This may include back or pelvic injuries, diabetes, multiple sclerosis, Parkinson's disease, or some surgeries.  SYMPTOMS  · Inability to get an erection.  · Lack of enough hardness to allow penetration.  · Loss of the erection before sex is finished.  · Premature ejaculation.  · Normal erections at some times, but with frequent unsatisfactory episodes.  · Orgasms that are not satisfactory in sensation or frequency.  · Low sexual satisfaction in either partner because of erection problems.  · A curved penis occurring with erection. The curve may cause pain or may be too curved to allow for intercourse.  · Never having nighttime erections.  DIAGNOSIS  Your caregiver can often diagnose this condition by:  · Performing a physical exam to find other diseases or specific problems with the penis.  · Asking you detailed questions about the problem.  · Performing blood tests to check for diabetes or to measure hormone levels.  · Performing urine tests to find other underlying health  conditions.  · Performing an ultrasound to check for scarring.  · Performing a test to check blood flow to the penis.  · Doing a sleep study at home to measure nighttime erections.  TREATMENT   · You may be prescribed medicines by mouth.  · You may be given medicine injections into the penis.  · You may be prescribed a vacuum pump with a ring.  · Penile implant surgery may be performed. You may receive:  · An inflatable implant.  · A semi-rigid implant.  · Blood vessel surgery may be performed.  HOME CARE INSTRUCTIONS  · Take all medicine as directed by your caregiver. Do not take any other medicines without talking to your caregiver first.  · Follow your caregiver's directions for specific treatments as prescribed.  · Follow up with your caregiver as directed.  Document Released: 05/28/2000 Document Revised: 08/23/2011 Document Reviewed: 09/20/2010  [employer]® Patient Information ©2013 [employer].

## 2012-09-28 NOTE — Progress Notes (Signed)
Subjective:    Patient ID: Derrick Hogan, male    DOB: 1985/12/26, 27 y.o.   MRN: [id number]  HPI 27 year old [place] American male, nonsmoker, is in with complaints of a lesion on his testicles has been present for several days. He describes it as painful with purulent drainage. Denies any burning or tingling from the area but is concerned of herpes. Will like STD screening. He is in a monogamous relationship x3 months. However, was previously a relationship for several years.  Patient has concerns of inability to maintain an erection. Reports his penis going soft during intercourse. Denies any previous history of any erectile dysfunction. He is obese.   Review of Systems  Constitutional: Negative.   Respiratory: Negative.   Cardiovascular: Negative.   Gastrointestinal: Negative.   Endocrine: Negative.   Genitourinary: Negative for dysuria, discharge, penile swelling and penile pain.       Erectile dysfunction  Musculoskeletal: Negative.   Skin: Positive for wound.       Lesion to the right testicle  Neurological: Negative.   Hematological: Negative.   Psychiatric/Behavioral: Negative.    Past Medical History  Diagnosis Date  . Asthma     History   Social History  . Marital Status: Single    Spouse Name: N/A    Number of Children: N/A  . Years of Education: N/A   Occupational History  . Not on file.   Social History Main Topics  . Smoking status: Never Smoker   . Smokeless tobacco: Not on file  . Alcohol Use: Yes     Comment: social  . Drug Use: No  . Sexually Active:    Other Topics Concern  . Not on file   Social History Narrative  . No narrative on file    Past Surgical History  Procedure Laterality Date  . Knee surgery      No family history on file.  Allergies  Allergen Reactions  . Tuberculin Tests     Current Outpatient Prescriptions on File Prior to Visit  Medication Sig Dispense Refill  . ALBUTEROL IN Inhale into the lungs as needed.       . ASPIRIN PO Take by mouth daily.      . Multiple Vitamin (MULTIVITAMIN) tablet Take 1 tablet by mouth daily.       No current facility-administered medications on file prior to visit.    BP 124/86  Pulse 87  Wt 403 lb 14.4 oz (183.208 kg)  BMI 56.36 kg/m2  SpO2 98%chart    Objective:   Physical Exam  Constitutional: He is oriented to person, place, and time. He appears well-developed and well-nourished.  Neck: Normal range of motion. Neck supple.  Cardiovascular: Normal rate, regular rhythm and normal heart sounds.   Pulmonary/Chest: Effort normal and breath sounds normal.  Abdominal: Soft. Bowel sounds are normal.  Genitourinary: Penis normal.  No lymphadenopathy  Neurological: He is alert and oriented to person, place, and time.  Skin: Skin is warm.  Pustule noted to the right testicle. Approximately 4 mm in size. Minimal pustular drainage. Mild tenderness.  Psychiatric: He has a normal mood and affect.          Assessment & Plan:  Assessment: 1. Folliculitis 2. Screening for sexually transmitted diseases 3. Erectile dysfunction  Plan: Lab sent to include HIV, testosterone level, HSV 1 and 2, Wounds culture will notify patient pending results. Triple antibiotic ointment to the affected area. Patient call the office if symptoms worsen or persist. Recheck  a schedule, and as needed.

## 2012-09-29 LAB — GC/CHLAMYDIA PROBE AMP, URINE
Chlamydia, Swab/Urine, PCR: NEGATIVE
GC Probe Amp, Urine: NEGATIVE

## 2012-10-01 LAB — WOUND CULTURE: Gram Stain: NONE SEEN

## 2012-10-25 ENCOUNTER — Ambulatory Visit (INDEPENDENT_AMBULATORY_CARE_PROVIDER_SITE_OTHER): Payer: BC Managed Care – PPO | Admitting: Family

## 2012-10-25 ENCOUNTER — Encounter: Payer: Self-pay | Admitting: Family

## 2012-10-25 VITALS — BP 126/82 | HR 92 | Temp 100.1°F | Wt >= 6400 oz

## 2012-10-25 DIAGNOSIS — R05 Cough: Secondary | ICD-10-CM

## 2012-10-25 DIAGNOSIS — J209 Acute bronchitis, unspecified: Secondary | ICD-10-CM

## 2012-10-25 MED ORDER — GUAIFENESIN-CODEINE 100-10 MG/5ML PO SYRP: 5.0000 mL | ORAL_SOLUTION | Freq: Three times a day (TID) | ORAL | Status: DC | PRN

## 2012-10-25 MED ORDER — PREDNISONE 20 MG PO TABS: 60.0000 mg | ORAL_TABLET | Freq: Every day | ORAL | Status: DC

## 2012-10-25 NOTE — Progress Notes (Signed)
  Subjective:    Patient ID: Derrick Hogan, male    DOB: 04/12/1986, 27 y.o.   MRN: [id number]  HPI 27 year old African American male, nonsmoker is in today with complaints of cough, fever, sore throat x1 week. Today, he actually feels better than he had felt. He has been taken over-the-counter cough medication that has not helped. Denies any sick contacts. He has a history of allergic rhinitis.   Review of Systems  Constitutional: Positive for fever. Negative for fatigue.  HENT: Positive for congestion and sore throat. Negative for sneezing, mouth sores and neck pain.   Eyes: Negative.   Respiratory: Positive for cough.   Cardiovascular: Negative.   Skin: Negative.   Allergic/Immunologic: Negative.   Neurological: Negative.   Psychiatric/Behavioral: Negative.    Past Medical History  Diagnosis Date  . Asthma     History   Social History  . Marital Status: Single    Spouse Name: N/A    Number of Children: N/A  . Years of Education: N/A   Occupational History  . Not on file.   Social History Main Topics  . Smoking status: Never Smoker   . Smokeless tobacco: Not on file  . Alcohol Use: Yes     Comment: social  . Drug Use: No  . Sexually Active:    Other Topics Concern  . Not on file   Social History Narrative  . No narrative on file    Past Surgical History  Procedure Laterality Date  . Knee surgery      No family history on file.  Allergies  Allergen Reactions  . Tuberculin Tests     Current Outpatient Prescriptions on File Prior to Visit  Medication Sig Dispense Refill  . ALBUTEROL IN Inhale into the lungs as needed.      . ASPIRIN PO Take by mouth daily.      . Multiple Vitamin (MULTIVITAMIN) tablet Take 1 tablet by mouth daily.       No current facility-administered medications on file prior to visit.    BP 126/82  Pulse 92  Temp(Src) 100.1 F (37.8 C) (Oral)  Wt 404 lb 8 oz (183.48 kg)  BMI 56.44 kg/m2  SpO2 98%chart     Objective:   Physical Exam  Constitutional: He is oriented to person, place, and time. He appears well-developed and well-nourished.  HENT:  Right Ear: External ear normal.  Left Ear: External ear normal.  Nose: Nose normal.  Mouth/Throat: Oropharynx is clear and moist.  Neck: Normal range of motion. Neck supple.  Cardiovascular: Normal rate, regular rhythm and normal heart sounds.   Pulmonary/Chest: Effort normal and breath sounds normal.  Neurological: He is alert and oriented to person, place, and time.  Skin: Skin is warm and dry.  Psychiatric: He has a normal mood and affect.          Assessment & Plan:  Assessment: 1. Upper Resp Infection 2. Cough 3. Fever  Plan: Robitussin-AC 1 teaspoon 3 times a day as needed for cough. Warned of drowsiness. Prednisone 60 mg by mouth every morning x3 days. Rest. Drink plenty of fluids. Patient call the office if symptoms worsen or persist. Recheck as scheduled, and as needed.

## 2012-10-25 NOTE — Patient Instructions (Addendum)

## 2013-03-27 ENCOUNTER — Telehealth: Payer: Self-pay | Admitting: Family

## 2013-03-27 NOTE — Telephone Encounter (Signed)
Pt would like to know if yo have samples of ALBUTEROL or advair inhaler

## 2013-03-28 NOTE — Telephone Encounter (Signed)
Left message to advise pt advair will be ready to pick up

## 2013-04-12 ENCOUNTER — Ambulatory Visit (INDEPENDENT_AMBULATORY_CARE_PROVIDER_SITE_OTHER): Payer: BC Managed Care – PPO | Admitting: Family

## 2013-04-12 ENCOUNTER — Encounter: Payer: Self-pay | Admitting: Family

## 2013-04-12 VITALS — BP 132/84 | HR 84 | Wt >= 6400 oz

## 2013-04-12 DIAGNOSIS — Z23 Encounter for immunization: Secondary | ICD-10-CM

## 2013-04-12 DIAGNOSIS — J45909 Unspecified asthma, uncomplicated: Secondary | ICD-10-CM | POA: Insufficient documentation

## 2013-04-12 DIAGNOSIS — R635 Abnormal weight gain: Secondary | ICD-10-CM

## 2013-04-12 DIAGNOSIS — E291 Testicular hypofunction: Secondary | ICD-10-CM

## 2013-04-12 LAB — COMPREHENSIVE METABOLIC PANEL
AST: 21 U/L (ref 0–37)
Albumin: 4.1 g/dL (ref 3.5–5.2)
Chloride: 104 mEq/L (ref 96–112)
Creatinine, Ser: 1.1 mg/dL (ref 0.4–1.5)
GFR: 108 mL/min (ref >60.00)
Total Bilirubin: 0.5 mg/dL (ref 0.3–1.2)
Total Protein: 7.8 g/dL (ref 6.0–8.3)

## 2013-04-12 LAB — TSH: TSH: 0.63 u[IU]/mL (ref 0.35–5.50)

## 2013-04-12 LAB — TESTOSTERONE: Testosterone: 353.5 ng/dL (ref 350.00–890.00)

## 2013-04-12 NOTE — Patient Instructions (Signed)
Erectile Dysfunction Erectile dysfunction (ED) is the inability to get a good enough erection to have sexual intercourse. ED may involve:  Inability to get an erection.  Lack of enough hardness to allow penetration.  Loss of the erection before sex is finished.  Premature ejaculation.  Any combination of these problems if they occur more than 25% of the time. CAUSES  Certain drugs, such as:  Pain relievers.  Antihistamines.  Antidepressants.  Blood pressure medicines.  Water pills.  Ulcer medicines.  Muscle relaxants.  Illegal drugs.  Excessive drinking.  Psychological causes, such as:  Anxiety.  Depression.  Sadness.  Exhaustion.  Performance fear.  Stress.  Physical causes, such as:  Artery problems. This may include diabetes, smoking, liver disease, or atherosclerosis.  High blood pressure.  Hormonal problems, such as low testosterone.  Obesity.  Nerve problems. This may include back or pelvic injuries, diabetes, multiple sclerosis, Parkinson's disease, or some surgeries. SYMPTOMS  Inability to get an erection.  Lack of enough hardness to allow penetration.  Loss of the erection before sex is finished.  Premature ejaculation.  Normal erections at some times, but with frequent unsatisfactory episodes.  Orgasms that are not satisfactory in sensation or frequency.  Low sexual satisfaction in either partner because of erection problems.  A curved penis occurring with erection. The curve may cause pain or may be too curved to allow for intercourse.  Never having nighttime erections. DIAGNOSIS Your caregiver can often diagnose this condition by:  Performing a physical exam to find other diseases or specific problems with the penis.  Asking you detailed questions about the problem.  Performing blood tests to check for diabetes or to measure hormone levels.  Performing urine tests to find other underlying health  conditions.  Performing an ultrasound to check for scarring.  Performing a test to check blood flow to the penis.  Doing a sleep study at home to measure nighttime erections. TREATMENT   You may be prescribed medicines by mouth.  You may be given medicine injections into the penis.  You may be prescribed a vacuum pump with a ring.  Penile implant surgery may be performed. You may receive:  An inflatable implant.  A semi-rigid implant.  Blood vessel surgery may be performed. HOME CARE INSTRUCTIONS  Take all medicine as directed by your caregiver. Do not take any other medicines without talking to your caregiver first.  Follow your caregiver's directions for specific treatments as prescribed.  Follow up with your caregiver as directed. Document Released: 05/28/2000 Document Revised: 08/23/2011 Document Reviewed: 09/20/2010 [facility] Patient Information 2014 [place], [place].

## 2013-04-12 NOTE — Progress Notes (Signed)
  Subjective:    Patient ID: Derrick Hogan, male    DOB: 1985-10-06, 27 y.o.   MRN: [id number]  HPI 27 year old [place] American male, nonsmoker, in requesting treatment of hypogonadism. He was found to have a low testosterone in April 2014. He reports having difficulty maintaining and achieving an erection. And often has difficulty with stamina. He is to come very concerned. Patient's weight is up from last office visit. He is now 409 pounds. Considering the bariatric clinic. Is not exercising.   Review of Systems  Constitutional: Positive for unexpected weight change.  Respiratory: Negative.   Cardiovascular: Negative.   Gastrointestinal: Negative.   Genitourinary:       Erectile dysfunction  Musculoskeletal: Negative.   Skin: Negative.   Neurological: Negative.   Psychiatric/Behavioral: Negative.    Past Medical History  Diagnosis Date  . Asthma     History   Social History  . Marital Status: Single    Spouse Name: N/A    Number of Children: N/A  . Years of Education: N/A   Occupational History  . Not on file.   Social History Main Topics  . Smoking status: Never Smoker   . Smokeless tobacco: Not on file  . Alcohol Use: Yes     Comment: social  . Drug Use: No  . Sexual Activity:    Other Topics Concern  . Not on file   Social History Narrative  . No narrative on file    Past Surgical History  Procedure Laterality Date  . Knee surgery      No family history on file.  Allergies  Allergen Reactions  . Tuberculin Tests     Current Outpatient Prescriptions on File Prior to Visit  Medication Sig Dispense Refill  . ALBUTEROL IN Inhale into the lungs as needed.      . ASPIRIN PO Take by mouth daily.      . Multiple Vitamin (MULTIVITAMIN) tablet Take 1 tablet by mouth daily.      Marland Kitchen guaiFENesin-codeine (ROBITUSSIN AC) 100-10 MG/5ML syrup Take 5 mLs by mouth 3 (three) times daily as needed for cough.  120 mL  0   No current facility-administered  medications on file prior to visit.    BP 132/84  Pulse 84  Wt 409 lb (185.521 kg)  BMI 57.07 kg/m2chart    Objective:   Physical Exam  Constitutional: He is oriented to person, place, and time. He appears well-developed.  Neck: Normal range of motion. Neck supple.  Cardiovascular: Normal rate, regular rhythm and normal heart sounds.   Pulmonary/Chest: Effort normal and breath sounds normal.  Abdominal: Soft. Bowel sounds are normal.  Musculoskeletal: Normal range of motion.  Neurological: He is alert and oriented to person, place, and time.  Skin: Skin is warm and dry.  Psychiatric: He has a normal mood and affect.          Assessment & Plan:  Assessment: 1. Hypogonadism 2. Morbidly obese 3. Erectile dysfunction  Plan: Labs sent to include testosterone level, TSH, BMP notify patient and her results. Strongly encourage weight reduction. Consider bariatric clinic. Call the office with any questions or concerns. We'll followup pending labs and sooner as needed.

## 2013-04-13 ENCOUNTER — Other Ambulatory Visit: Payer: Self-pay | Admitting: Family

## 2013-04-13 MED ORDER — TESTOSTERONE CYPIONATE 200 MG/ML IM SOLN: 200.0000 mg | INTRAMUSCULAR | Status: DC

## 2013-04-25 ENCOUNTER — Telehealth: Payer: Self-pay | Admitting: Family

## 2013-04-25 NOTE — Telephone Encounter (Signed)
Left message for pt to call back  °

## 2013-04-25 NOTE — Telephone Encounter (Signed)
Pt calling back concerning testosterone rx. I was told you need to speak w/ him.

## 2013-04-26 NOTE — Telephone Encounter (Signed)
Spoke with pt. Clarification made on pharmacy. Pt will pick Rx up from [facility] and have inj done in the office

## 2013-05-15 ENCOUNTER — Ambulatory Visit (INDEPENDENT_AMBULATORY_CARE_PROVIDER_SITE_OTHER): Payer: BC Managed Care – PPO

## 2013-05-15 DIAGNOSIS — E291 Testicular hypofunction: Secondary | ICD-10-CM

## 2013-05-15 DIAGNOSIS — R7989 Other specified abnormal findings of blood chemistry: Secondary | ICD-10-CM

## 2013-05-15 DIAGNOSIS — N529 Male erectile dysfunction, unspecified: Secondary | ICD-10-CM

## 2013-05-15 MED ORDER — TESTOSTERONE CYPIONATE 200 MG/ML IM SOLN
200.0000 mg | INTRAMUSCULAR | Status: DC
  Administered 2013-05-15: 200 mg via INTRAMUSCULAR

## 2013-05-23 ENCOUNTER — Telehealth: Payer: Self-pay | Admitting: Family

## 2013-05-23 MED ORDER — FLUTICASONE-SALMETEROL 250-50 MCG/DOSE IN AEPB: 1.0000 | INHALATION_SPRAY | Freq: Two times a day (BID) | RESPIRATORY_TRACT | Status: AC

## 2013-05-23 NOTE — Telephone Encounter (Signed)
Pt aware I can send Rx to pharmacy

## 2013-05-23 NOTE — Telephone Encounter (Signed)
Pt is requesting 2 boxes (samples) of the advair or an inhaler? Please advise.

## 2013-05-29 ENCOUNTER — Ambulatory Visit: Payer: BC Managed Care – PPO

## 2013-05-30 ENCOUNTER — Ambulatory Visit: Payer: BC Managed Care – PPO

## 2013-06-01 ENCOUNTER — Ambulatory Visit (INDEPENDENT_AMBULATORY_CARE_PROVIDER_SITE_OTHER): Payer: BC Managed Care – PPO

## 2013-06-01 ENCOUNTER — Encounter: Payer: Self-pay | Admitting: Family

## 2013-06-01 DIAGNOSIS — N529 Male erectile dysfunction, unspecified: Secondary | ICD-10-CM

## 2013-06-01 DIAGNOSIS — E291 Testicular hypofunction: Secondary | ICD-10-CM

## 2013-06-01 DIAGNOSIS — R7989 Other specified abnormal findings of blood chemistry: Secondary | ICD-10-CM

## 2013-06-01 MED ORDER — TESTOSTERONE CYPIONATE 200 MG/ML IM SOLN
200.0000 mg | Freq: Once | INTRAMUSCULAR | Status: AC
  Administered 2013-06-01: 200 mg via INTRAMUSCULAR

## 2013-06-12 ENCOUNTER — Ambulatory Visit (INDEPENDENT_AMBULATORY_CARE_PROVIDER_SITE_OTHER): Payer: BC Managed Care – PPO | Admitting: Family

## 2013-06-12 ENCOUNTER — Encounter: Payer: Self-pay | Admitting: Family

## 2013-06-12 ENCOUNTER — Other Ambulatory Visit: Payer: Self-pay | Admitting: Family

## 2013-06-12 VITALS — BP 116/80 | HR 101 | Wt >= 6400 oz

## 2013-06-12 DIAGNOSIS — R42 Dizziness and giddiness: Secondary | ICD-10-CM

## 2013-06-12 DIAGNOSIS — R635 Abnormal weight gain: Secondary | ICD-10-CM

## 2013-06-12 LAB — BASIC METABOLIC PANEL
BUN: 11 mg/dL (ref 6–23)
CO2: 26 mEq/L (ref 19–32)
Chloride: 106 mEq/L (ref 96–112)
Creatinine, Ser: 1 mg/dL (ref 0.4–1.5)
Potassium: 4 mEq/L (ref 3.5–5.1)

## 2013-06-12 LAB — CBC WITH DIFFERENTIAL/PLATELET
Eosinophils Relative: 2.2 % (ref 0.0–5.0)
Neutro Abs: 8 10*3/uL — ABNORMAL HIGH (ref 1.4–7.7)
RBC: 5.82 Mil/uL — ABNORMAL HIGH (ref 4.22–5.81)
RDW: 14.8 % — ABNORMAL HIGH (ref 11.5–14.6)
WBC: 11.9 10*3/uL — ABNORMAL HIGH (ref 4.5–10.5)

## 2013-06-12 NOTE — Patient Instructions (Signed)

## 2013-06-12 NOTE — Progress Notes (Signed)
Subjective:    Patient ID: Derrick Hogan, male    DOB: [birth year], 27 y.o.   MRN: [id number]  HPI 27 year old African American male, obese, is in today with complaints of dizziness x2-3 months that's begin to worsen over the last 2-3 weeks. Patient reports the dizziness occurs with coughing or laughing hard. He felt like he is about to pass out when the episodes occur. He denies any chest pain or palpitations, no shortness of breath. Reports decreased energy after these episodes occur. He has a family history of heart failure his father who is deceased at age 48. Patient's weight is up 5 pounds from last office visit. He is not exercising.   Review of Systems  Constitutional: Positive for unexpected weight change.       Weight gain  HENT: Negative.   Respiratory: Negative.   Cardiovascular: Negative.  Negative for chest pain, palpitations and leg swelling.  Gastrointestinal: Negative.   Endocrine: Negative.   Genitourinary: Negative.   Musculoskeletal: Negative.   Skin: Negative.   Neurological: Positive for dizziness. Negative for headaches.  Hematological: Negative.   Psychiatric/Behavioral: Negative.    Past Medical History  Diagnosis Date  . Asthma     History   Social History  . Marital Status: Single    Spouse Name: N/A    Number of Children: N/A  . Years of Education: N/A   Occupational History  . Not on file.   Social History Main Topics  . Smoking status: Never Smoker   . Smokeless tobacco: Not on file  . Alcohol Use: Yes     Comment: social  . Drug Use: No  . Sexual Activity:    Other Topics Concern  . Not on file   Social History Narrative  . No narrative on file    Past Surgical History  Procedure Laterality Date  . Knee surgery      No family history on file.  Allergies  Allergen Reactions  . Tuberculin Tests     Current Outpatient Prescriptions on File Prior to Visit  Medication Sig Dispense Refill  . ALBUTEROL IN Inhale into the  lungs as needed.      . ASPIRIN PO Take by mouth daily.      . Fluticasone-Salmeterol (ADVAIR DISKUS) 250-50 MCG/DOSE AEPB Inhale 1 puff into the lungs 2 (two) times daily.  1 each  3  . Multiple Vitamin (MULTIVITAMIN) tablet Take 1 tablet by mouth daily.       No current facility-administered medications on file prior to visit.    BP 116/80  Pulse 101  Wt 414 lb 6.4 oz (187.971 kg)chart    Objective:   Physical Exam  Constitutional: He is oriented to person, place, and time. He appears well-developed and well-nourished.  HENT:  Right Ear: External ear normal.  Left Ear: External ear normal.  Mouth/Throat: Oropharynx is clear and moist.  Eyes: Conjunctivae are normal. Pupils are equal, round, and reactive to light.  Neck: Normal range of motion. Neck supple.  Cardiovascular: Normal rate, regular rhythm and normal heart sounds.   Pulmonary/Chest: Effort normal and breath sounds normal.  Abdominal: Soft. Bowel sounds are normal.  Musculoskeletal: Normal range of motion.  Neurological: He is alert and oriented to person, place, and time.  Skin: Skin is warm and dry.  Psychiatric: He has a normal mood and affect.           Assessment & Plan:  Assessment: 1. Dizziness 2. Morbidly obese  Plan: I've  explained to patient that his symptoms are likely related to stress on his heart related to being 414 pounds. I have encouraged him over the last 6-8 months to reduce his weight. Yet, he continues to gain weight. Is not currently exercising. I have it again advised exercise after being cleared by cardiology.

## 2013-06-13 ENCOUNTER — Telehealth: Payer: Self-pay

## 2013-06-13 MED ORDER — MECLIZINE HCL 50 MG PO TABS: 50.0000 mg | ORAL_TABLET | Freq: Three times a day (TID) | ORAL | Status: AC | PRN

## 2013-06-13 NOTE — Telephone Encounter (Signed)
Spoke with pt concerning cardiology referral. He would also like a referral for a nutritionist and meds sent to pharmacy for dizziness.  Referral placed and meclezine sent to [facility]

## 2013-06-28 ENCOUNTER — Ambulatory Visit (INDEPENDENT_AMBULATORY_CARE_PROVIDER_SITE_OTHER): Payer: BC Managed Care – PPO

## 2013-06-28 DIAGNOSIS — R7989 Other specified abnormal findings of blood chemistry: Secondary | ICD-10-CM

## 2013-06-28 DIAGNOSIS — E291 Testicular hypofunction: Secondary | ICD-10-CM

## 2013-06-28 MED ORDER — TESTOSTERONE CYPIONATE 200 MG/ML IM SOLN
200.0000 mg | INTRAMUSCULAR | Status: DC
  Administered 2013-06-28: 200 mg via INTRAMUSCULAR

## 2013-07-18 ENCOUNTER — Encounter: Payer: Self-pay | Admitting: General Surgery

## 2013-07-18 ENCOUNTER — Encounter: Payer: Self-pay | Admitting: Cardiology

## 2013-07-18 ENCOUNTER — Ambulatory Visit (INDEPENDENT_AMBULATORY_CARE_PROVIDER_SITE_OTHER): Payer: BC Managed Care – PPO | Admitting: Cardiology

## 2013-07-18 ENCOUNTER — Ambulatory Visit: Payer: BC Managed Care – PPO | Admitting: Cardiology

## 2013-07-18 VITALS — BP 173/86 | HR 109 | Ht 71.0 in | Wt >= 6400 oz

## 2013-07-18 DIAGNOSIS — R079 Chest pain, unspecified: Secondary | ICD-10-CM | POA: Insufficient documentation

## 2013-07-18 DIAGNOSIS — R42 Dizziness and giddiness: Secondary | ICD-10-CM

## 2013-07-18 NOTE — Progress Notes (Signed)
   [address] [place], [place]  [address] Phone: [phone] Fax:  [phone]  Date:  07/18/2013   ID:  Derrick ServerJeremy L Hogan, DOB 08/01/1985, MRN [id number]  PCP:  Janell QuietAMPBELL, PADONDA BOYD, FNP  Cardiologist:  Armanda Magicraci Turner, MD     History of Present Illness: Derrick Hogan is a 28 y.o. male with a history of morbid obesity who presents for evaluation of dizziness.  This started back in December.  He had some before that which was related to wax in his ears but this is different.  He says that if he coughs or if he yawns and then goes to stand up he feels lightheaded and feels like he is vibrating and zones out.  It usually happens when he is very tired.  He denies any syncope.  He denies any palpitations unless he gets nervous or anxious.  He says that he occasionally has some pressure in his chest that is nonexertional lasting 5-10 minutes and resolves.  He has chronic SOB due to asthma.  He denies any LE edema   Wt Readings from Last 3 Encounters:  07/18/13 409 lb (185.521 kg)  06/12/13 414 lb 6.4 oz (187.971 kg)  04/12/13 409 lb (185.521 kg)     Past Medical History  Diagnosis Date  . Asthma     Current Outpatient Prescriptions  Medication Sig Dispense Refill  . ALBUTEROL IN Inhale into the lungs as needed.      . ASPIRIN PO Take 81 mg by mouth daily.       . Fluticasone-Salmeterol (ADVAIR DISKUS) 250-50 MCG/DOSE AEPB Inhale 1 puff into the lungs 2 (two) times daily.  1 each  3  . meclizine (ANTIVERT) 50 MG tablet Take 1 tablet (50 mg total) by mouth 3 (three) times daily as needed.  30 tablet  0  . Multiple Vitamin (MULTIVITAMIN) tablet Take 1 tablet by mouth daily.       No current facility-administered medications for this visit.    Allergies:    Allergies  Allergen Reactions  . Tuberculin Tests     Social History:  The patient  reports that he has never smoked. He does not have any smokeless tobacco history on file. He reports that he drinks alcohol. He  reports that he does not use illicit drugs.   Family History:  The patient's family history includes Heart failure in his father; Hypertension in his father.   ROS:  Please see the history of present illness.      All other systems reviewed and negative.   PHYSICAL EXAM: VS:  BP 173/86  Pulse 109  Ht 5\' 11"  (1.803 m)  Wt 409 lb (185.521 kg)  BMI 57.07 kg/m2 Well nourished, well developed, in no acute distress HEENT: normal Neck: no JVD Cardiac:  normal S1, S2; RRR; no murmur Lungs:  clear to auscultation bilaterally, no wheezing, rhonchi or rales Abd: soft, nontender, no hepatomegaly Ext: no edema Skin: warm and dry Neuro:  CNs 2-12 intact, no focal abnormalities noted      ASSESSMENT AND PLAN:  1. Dizziness of ? Etiology.  It almost sounds like it could be neurocardiogenic when induced by cough.    - event monitor to assess for primary arrhythmia  - 2D echo to assess LVF 2.  Chest pain atypical   - ETT  2. Morbid obesity  Followup with me in 4 weeks  Signed, Armanda Magicraci Turner, MD 07/18/2013 3:17 PM

## 2013-07-18 NOTE — Patient Instructions (Addendum)
Your physician recommends that you continue on your current medications as directed. Please refer to the Current Medication list given to you today.  Your physician has requested that you have an echocardiogram. Echocardiography is a painless test that uses sound waves to create images of your heart. It provides your doctor with information about the size and shape of your heart and how well your heart's chambers and valves are working. This procedure takes approximately one hour. There are no restrictions for this procedure.  Your physician has requested that you have an exercise tolerance test. For further information please visit [email or web address]. Please also follow instruction sheet, as given.  Your physician has recommended that you wear an event monitor. Event monitors are medical devices that record the heart's electrical activity. Doctors most often us these monitors to diagnose arrhythmias. Arrhythmias are problems with the speed or rhythm of the heartbeat. The monitor is a small, portable device. You can wear one while you do your normal daily activities. This is usually used to diagnose what is causing palpitations/syncope (passing out).  Your physician recommends that you schedule a follow-up appointment in: 4 Weeks with Dr Mayford Knifeurner

## 2013-07-24 ENCOUNTER — Encounter (HOSPITAL_COMMUNITY): Payer: BC Managed Care – PPO

## 2013-07-26 ENCOUNTER — Ambulatory Visit (INDEPENDENT_AMBULATORY_CARE_PROVIDER_SITE_OTHER): Payer: BC Managed Care – PPO

## 2013-07-26 DIAGNOSIS — R7989 Other specified abnormal findings of blood chemistry: Secondary | ICD-10-CM

## 2013-07-26 DIAGNOSIS — E291 Testicular hypofunction: Secondary | ICD-10-CM

## 2013-07-26 MED ORDER — TESTOSTERONE CYPIONATE 200 MG/ML IM SOLN
200.0000 mg | Freq: Once | INTRAMUSCULAR | Status: AC
  Administered 2013-07-26: 200 mg via INTRAMUSCULAR

## 2013-07-30 ENCOUNTER — Encounter: Payer: BC Managed Care – PPO | Admitting: Physician Assistant

## 2013-08-02 ENCOUNTER — Encounter: Payer: Self-pay | Admitting: Family

## 2013-08-02 ENCOUNTER — Telehealth: Payer: Self-pay | Admitting: Family

## 2013-08-02 NOTE — Telephone Encounter (Signed)
Pt would like a cb asap in the am concerning lab results

## 2013-08-03 NOTE — Telephone Encounter (Signed)
Left message for pt to call abck

## 2013-08-07 NOTE — Telephone Encounter (Signed)
Pt states that he got issue resolved

## 2013-08-09 ENCOUNTER — Other Ambulatory Visit (HOSPITAL_COMMUNITY): Payer: BC Managed Care – PPO
